# Patient Record
Sex: Female | Born: 1960
Health system: Southern US, Community
[De-identification: ages and names within clinical notes are randomized; demographics above are authoritative.]

## PROBLEM LIST (undated history)

## (undated) DIAGNOSIS — J189 Pneumonia, unspecified organism: Secondary | ICD-10-CM

## (undated) HISTORY — DX: Pneumonia, unspecified organism: J18.9

---

## 2014-09-27 ENCOUNTER — Emergency Department: Payer: Self-pay | Admitting: Emergency Medicine

## 2016-03-18 ENCOUNTER — Other Ambulatory Visit: Payer: Self-pay | Admitting: Family Medicine

## 2016-03-18 ENCOUNTER — Ambulatory Visit
Admission: RE | Admit: 2016-03-18 | Discharge: 2016-03-18 | Disposition: A | Discharge status: Home / Self Care | Payer: BLUE CROSS/BLUE SHIELD | Source: Ambulatory Visit | Attending: Family Medicine | Admitting: Family Medicine

## 2016-03-18 ENCOUNTER — Ambulatory Visit (INDEPENDENT_AMBULATORY_CARE_PROVIDER_SITE_OTHER): Payer: Self-pay | Admitting: Family Medicine

## 2016-03-18 ENCOUNTER — Encounter: Payer: Self-pay | Admitting: Family Medicine

## 2016-03-18 VITALS — BP 119/70 | HR 71 | Temp 98.2°F | Resp 16 | Ht 66.0 in | Wt 144.4 lb

## 2016-03-18 DIAGNOSIS — Z7189 Other specified counseling: Secondary | ICD-10-CM | POA: Diagnosis not present

## 2016-03-18 DIAGNOSIS — Z8249 Family history of ischemic heart disease and other diseases of the circulatory system: Secondary | ICD-10-CM | POA: Diagnosis not present

## 2016-03-18 DIAGNOSIS — Z1239 Encounter for other screening for malignant neoplasm of breast: Secondary | ICD-10-CM

## 2016-03-18 DIAGNOSIS — R918 Other nonspecific abnormal finding of lung field: Secondary | ICD-10-CM | POA: Insufficient documentation

## 2016-03-18 DIAGNOSIS — R05 Cough: Secondary | ICD-10-CM | POA: Insufficient documentation

## 2016-03-18 DIAGNOSIS — R052 Subacute cough: Secondary | ICD-10-CM

## 2016-03-18 DIAGNOSIS — Z1211 Encounter for screening for malignant neoplasm of colon: Secondary | ICD-10-CM | POA: Diagnosis not present

## 2016-03-18 DIAGNOSIS — Z23 Encounter for immunization: Secondary | ICD-10-CM

## 2016-03-18 DIAGNOSIS — Z7689 Persons encountering health services in other specified circumstances: Secondary | ICD-10-CM

## 2016-03-18 DIAGNOSIS — J181 Lobar pneumonia, unspecified organism: Principal | ICD-10-CM

## 2016-03-18 DIAGNOSIS — R053 Chronic cough: Secondary | ICD-10-CM | POA: Insufficient documentation

## 2016-03-18 DIAGNOSIS — J189 Pneumonia, unspecified organism: Secondary | ICD-10-CM

## 2016-03-18 MED ORDER — LORATADINE 10 MG PO TABS: 10.0000 mg | ORAL_TABLET | Freq: Every day | ORAL | Status: AC

## 2016-03-18 MED ORDER — FLUTICASONE PROPIONATE 50 MCG/ACT NA SUSP: 2.0000 | Freq: Every day | NASAL | Status: AC

## 2016-03-18 NOTE — Assessment & Plan Note (Signed)
Post viral cough vs. Allergies. CXR to r/o lung disease. Continue anti-histamine at home. Add fluticasone to help with allergies. Consider silent GERD if symptoms persist. Consider pulmonology referral if persistent.  F/u if not improving.

## 2016-03-18 NOTE — Patient Instructions (Signed)
Cough: Likely due to allergies. We will get a chest XR to rule out that anything is going on in your lungs. Take fluticasone daily to help with symptoms. Continue allergy pills.  Please seek immediate medical attention if you develop shortness of breath not relieve by inhaler, chest pain/tightness, fever > 103 F or other concerning symptoms.    We need a to a well woman exam at one point this year to get your pap smear updated.

## 2016-03-18 NOTE — Telephone Encounter (Signed)
LMTCB: If she calls back please review following information:  CXR does show a pneumonia. Would like to treat her with levofloxacin antibiotic 750mg  once daily for 5 days. We need to do a follow-up chest XR in 4 weeks to ensure it is gone.

## 2016-03-18 NOTE — Progress Notes (Signed)
Subjective:    Patient ID: Regina Dixon, female    DOB: Dec 07, 1960, 55 y.o.   MRN: [id number]  HPI: Regina Dixon is a 55 y.o. female presenting on 03/18/2016 for Establish Care   HPI  Pt presents to establish care today. Previous care provider was in [place].  It has been several years since Her last PCP visit. Records from previous provider will be requested and reviewed. Current medical problems include:  Crush injury to foot at work to L foot. Seen by orthopedics.  Cough- since February. Nurse at work gave her inhaler and Zpak- helped for a little bit- took a steroid. Never had a chest XR. Has improved since starting allergy pills. Cough is not productive of sputum. Some chest tightness. No fevers at home. No linger nasal congestion or ear pain. Throat feels dry.   Health maintenance:  Last pap smear- 20 years. Had 1 abnormal pap smear- years ago. All normal since.  Never had a colonoscopy: Desires cologuard. Mammograms: Had 1- normal. Great had Breast cancer- diagnosed over age 67.  TDAP- has not had in several years.  Father died from brain anuerysm. Cardiac stents.     Past Medical History  Diagnosis Date  . Chronic kidney disease     kidney stone   Social History   Social History  . Marital Status: Single    Spouse Name: N/A  . Number of Children: N/A  . Years of Education: N/A   Occupational History  . Not on file.   Social History Main Topics  . Smoking status: Never Smoker   . Smokeless tobacco: Not on file  . Alcohol Use: Yes  . Drug Use: No  . Sexual Activity: Not on file   Other Topics Concern  . Not on file   Social History Narrative  . No narrative on file   Family History  Problem Relation Age of Onset  . Anuerysm Father   . Hypertension Mother    No current outpatient prescriptions on file prior to visit.   No current facility-administered medications on file prior to visit.    Review of Systems  Constitutional: Negative for fever and  chills.  HENT: Positive for postnasal drip. Negative for congestion, sinus pressure, sneezing and trouble swallowing.   Respiratory: Positive for cough. Negative for chest tightness, shortness of breath and wheezing.   Cardiovascular: Negative for chest pain and leg swelling.  Gastrointestinal: Negative for nausea, vomiting, abdominal pain, diarrhea and constipation.  Endocrine: Negative.  Negative for cold intolerance, heat intolerance, polydipsia, polyphagia and polyuria.  Genitourinary: Negative for dysuria and difficulty urinating.  Musculoskeletal: Negative.  Negative for neck pain and neck stiffness.  Skin: Negative for color change and rash.  Neurological: Negative for dizziness, light-headedness and numbness.  Psychiatric/Behavioral: Negative.    Per HPI unless specifically indicated above     Objective:    BP 119/70 mmHg  Pulse 71  Temp(Src) 98.2 F (36.8 C) (Oral)  Resp 16  Ht  (1.676 m)  Wt 144 lb 6.4 oz (65.499 kg)  BMI 23.32 kg/m2  SpO2 99%  LMP   Wt Readings from Last 3 Encounters:  03/18/16 144 lb 6.4 oz (65.499 kg)    Physical Exam  Constitutional: She is oriented to person, place, and time. She appears well-developed and well-nourished.  HENT:  Head: Normocephalic and atraumatic.  Right Ear: Hearing and tympanic membrane normal.  Left Ear: Hearing and tympanic membrane normal.  Nose: Mucosal edema present. No rhinorrhea. Right sinus  exhibits no maxillary sinus tenderness and no frontal sinus tenderness. Left sinus exhibits no maxillary sinus tenderness and no frontal sinus tenderness.  Mouth/Throat: Uvula is midline and mucous membranes are normal.  Cobblestoning of oropharynx.   Neck: Normal range of motion. Neck supple. No thyromegaly present.  Cardiovascular: Normal rate, regular rhythm and normal heart sounds.  Exam reveals no gallop and no friction rub.   No murmur heard. Pulmonary/Chest: Effort normal and breath sounds normal. She has no wheezes.  She exhibits no tenderness.  Abdominal: Soft. Normal appearance and bowel sounds are normal. She exhibits no distension and no mass. There is no tenderness. There is no rebound and no guarding.  Musculoskeletal: Normal range of motion. She exhibits no edema or tenderness.  Lymphadenopathy:    She has no cervical adenopathy.  Neurological: She is alert and oriented to person, place, and time.  Skin: Skin is warm and dry.  Psychiatric: She has a normal mood and affect. Her behavior is normal. Judgment and thought content normal.   No results found for this or any previous visit.    Assessment & Plan:   Problem List Items Addressed This Visit      Other   Subacute cough    Post viral cough vs. Allergies. CXR to r/o lung disease. Continue anti-histamine at home. Add fluticasone to help with allergies. Consider silent GERD if symptoms persist. Consider pulmonology referral if persistent.  F/u if not improving.       Relevant Orders   Comprehensive metabolic panel   DG Chest 2 View    Other Visit Diagnoses    Encounter to establish care    -  Primary    Screening for breast cancer        Mammogram order placed.     Relevant Orders    MM Digital Screening    Screening for colon cancer        Cologuard ordered.     Relevant Orders    Cologuard    Family history of heart disease        check baseline lipid panel.     Relevant Orders    Lipid Profile       Meds ordered this encounter  Medications  . fluticasone (FLONASE) 50 MCG/ACT nasal spray    Sig: Place 2 sprays into both nostrils daily.    Dispense:  16 g    Refill:  11    Order Specific Question:  Supervising Provider    Answer:  Janeann ForehandHAWKINS JR, JAMES H [phone][[id number]]  . loratadine (CLARITIN) 10 MG tablet    Sig: Take 1 tablet (10 mg total) by mouth daily.    Dispense:  30 tablet    Refill:  11    Order Specific Question:  Supervising Provider    Answer:  Janeann ForehandHAWKINS JR, JAMES H [phone][[id number]]      Follow up plan: Return if  symptoms worsen or fail to improve, for Well woman. .Marland Kitchen

## 2016-03-19 MED ORDER — LEVOFLOXACIN 750 MG PO TABS: 750.0000 mg | ORAL_TABLET | Freq: Every day | ORAL | Status: DC

## 2016-03-19 NOTE — Telephone Encounter (Signed)
Called to review chest XR. VM on cell is not set up. Have left a message with Weyman CroonStan (family member) at home number [facility] Per family she works until [employer] and won't be available to call back until Saturday. Reviewed XR results with family and need for treatment. Have seen to her pharmacy on file. She will call with any questions or concerns about antibiotic treatment.

## 2016-03-27 ENCOUNTER — Telehealth: Payer: Self-pay | Admitting: Family Medicine

## 2016-03-27 MED ORDER — PREDNISONE 20 MG PO TABS: 40.0000 mg | ORAL_TABLET | Freq: Every day | ORAL | Status: DC

## 2016-03-27 MED ORDER — DM-GUAIFENESIN ER 30-600 MG PO TB12: 1.0000 | ORAL_TABLET | Freq: Two times a day (BID) | ORAL | Status: DC

## 2016-03-27 NOTE — Telephone Encounter (Signed)
Called pt still coughing non productive cough but no other Sx but feels chest congestion please suggest?

## 2016-03-27 NOTE — Telephone Encounter (Signed)
Called pt LMTCB: She did have pneumonia on CXR so the cough can linger. Let's try prednisone to help calm the cough. She can also take Mucinex DM OTC to help with symptoms. Keep taking allergy pills. We will do a follow-up CXR the week of June 4. Call if symptoms not improving. Alarm symptoms reviewed.

## 2016-03-27 NOTE — Telephone Encounter (Signed)
Pt was in on 5/9 and is still coughing.  Please call [phone]

## 2016-04-09 ENCOUNTER — Ambulatory Visit
Admission: RE | Admit: 2016-04-09 | Discharge: 2016-04-09 | Disposition: A | Discharge status: Home / Self Care | Payer: BLUE CROSS/BLUE SHIELD | Source: Ambulatory Visit | Attending: Family Medicine | Admitting: Family Medicine

## 2016-04-09 ENCOUNTER — Encounter: Payer: Self-pay | Admitting: Family Medicine

## 2016-04-09 ENCOUNTER — Ambulatory Visit (INDEPENDENT_AMBULATORY_CARE_PROVIDER_SITE_OTHER): Payer: BLUE CROSS/BLUE SHIELD | Admitting: Family Medicine

## 2016-04-09 VITALS — BP 122/65 | HR 70 | Temp 98.5°F | Resp 16 | Ht 66.0 in | Wt 142.0 lb

## 2016-04-09 DIAGNOSIS — Z1211 Encounter for screening for malignant neoplasm of colon: Secondary | ICD-10-CM | POA: Diagnosis not present

## 2016-04-09 DIAGNOSIS — R05 Cough: Secondary | ICD-10-CM | POA: Diagnosis not present

## 2016-04-09 DIAGNOSIS — R052 Subacute cough: Secondary | ICD-10-CM

## 2016-04-09 DIAGNOSIS — Z1212 Encounter for screening for malignant neoplasm of rectum: Secondary | ICD-10-CM | POA: Diagnosis not present

## 2016-04-09 MED ORDER — FLUTICASONE PROPIONATE HFA 110 MCG/ACT IN AERO: 2.0000 | INHALATION_SPRAY | Freq: Two times a day (BID) | RESPIRATORY_TRACT | Status: DC

## 2016-04-09 MED ORDER — ALBUTEROL SULFATE HFA 108 (90 BASE) MCG/ACT IN AERS: 2.0000 | INHALATION_SPRAY | Freq: Four times a day (QID) | RESPIRATORY_TRACT | Status: AC | PRN

## 2016-04-09 NOTE — Progress Notes (Signed)
Subjective:    Patient ID: Adrienne Suydam, female    DOB: 26-May-1961, 55 y.o.   MRN: [id number]  HPI: Nychelle Cassata is a 55 y.o. female presenting on 04/09/2016 for Cough   HPI   Patient here today for follow up for cough, chest congestion and "heavy-chested." Has also been more tired than usual. Took Levaquin as directed as well as expectorant with no relief. Dry cough but denies fever, chills, night sweats, dizziness or other complaints. Denies shortness of breath. Taking loratadine and flonase daily with but hasn't noticed a difference. Had a "sneezing fit" today and states that coughing became worse. Cough worse when she wakes up and at night.  Has had 2 rounds of everything (steroids, antibiotics) with no improvement.   Past Medical History  Diagnosis Date  . Chronic kidney disease     kidney stone    Current Outpatient Prescriptions on File Prior to Visit  Medication Sig  . fluticasone (FLONASE) 50 MCG/ACT nasal spray Place 2 sprays into both nostrils daily.  Marland Kitchen loratadine (CLARITIN) 10 MG tablet Take 1 tablet (10 mg total) by mouth daily.   No current facility-administered medications on file prior to visit.    Review of Systems  Constitutional: Positive for fatigue. Negative for fever, chills, diaphoresis, activity change, appetite change and unexpected weight change.  HENT: Positive for congestion and sneezing. Negative for dental problem, drooling, ear pain, facial swelling, hearing loss, nosebleeds, postnasal drip, rhinorrhea, sinus pressure, sore throat and trouble swallowing.        "Chest heaviness and congestion"  Eyes: Negative for pain, itching and visual disturbance.  Respiratory: Positive for cough and chest tightness. Negative for shortness of breath, wheezing and stridor.   Cardiovascular: Negative for chest pain, palpitations and leg swelling.  Gastrointestinal: Negative for nausea, vomiting, abdominal pain, diarrhea, constipation and abdominal distention.    Endocrine: Negative for cold intolerance and heat intolerance.  Genitourinary: Negative for frequency and difficulty urinating.  Musculoskeletal: Negative for myalgias and arthralgias.  Neurological: Negative for dizziness.  Psychiatric/Behavioral: Negative for suicidal ideas, behavioral problems, confusion and sleep disturbance. The patient is not nervous/anxious.    Per HPI unless specifically indicated above     Objective:    BP 122/65 mmHg  Pulse 70  Temp(Src) 98.5 F (36.9 C) (Oral)  Resp 16  Ht  (1.676 m)  Wt 142 lb (64.411 kg)  BMI 22.93 kg/m2  SpO2 100%  Wt Readings from Last 3 Encounters:  04/09/16 142 lb (64.411 kg)  03/18/16 144 lb 6.4 oz (65.499 kg)    Physical Exam  Constitutional: She is oriented to person, place, and time. She appears well-developed and well-nourished.  HENT:  Head: Normocephalic.  Neck: Normal range of motion.  Cardiovascular: Normal rate, regular rhythm and normal heart sounds.   Pulmonary/Chest: Effort normal and breath sounds normal. No accessory muscle usage. No tachypnea. No respiratory distress. She has no decreased breath sounds. She has no wheezes. She has no rhonchi. She has no rales. Chest wall is not dull to percussion. She exhibits no mass, no tenderness, no crepitus, no deformity and no retraction.  Musculoskeletal: Normal range of motion.  Neurological: She is alert and oriented to person, place, and time.  Skin: Skin is warm and dry.  Psychiatric: She has a normal mood and affect. Her behavior is normal. Judgment and thought content normal.  Nursing note and vitals reviewed.  No results found for this oMabel Rollious visit.    Assessment & Plan:  Problem List Items Addressed This Visit      Other   Subacute cough - Primary    Post infectious cough vs asthma type reaction. CXR is clear. Plan for 2 weeks ICS and albuterol. If still coughing and no improvement- perform spirometry. Consider pulmonology referral.        Relevant Medications   albuterol (PROVENTIL HFA;VENTOLIN HFA) 108 (90 Base) MCG/ACT inhaler   fluticasone (FLOVENT HFA) 110 MCG/ACT inhaler   Other Relevant Orders   DG Chest 2 View (Completed)      Meds ordered this encounter  Medications  . albuterol (PROVENTIL HFA;VENTOLIN HFA) 108 (90 Base) MCG/ACT inhaler    Sig: Inhale 2 puffs into the lungs every 6 (six) hours as needed for wheezing or shortness of breath.    Dispense:  1 Inhaler    Refill:  11    Order Specific Question:  Supervising Provider    Answer:  Janeann ForehandHAWKINS JR, JAMES H [phone][[id number]]  . fluticasone (FLOVENT HFA) 110 MCG/ACT inhaler    Sig: Inhale 2 puffs into the lungs 2 (two) times daily.    Dispense:  1 Inhaler    Refill:  12    Order Specific Question:  Supervising Provider    Answer:  Janeann ForehandHAWKINS JR, JAMES H [[phone]]      Follow up plan: Return in about 2 weeks (around 04/23/2016).

## 2016-04-09 NOTE — Assessment & Plan Note (Signed)
Post infectious cough vs asthma type reaction. CXR is clear. Plan for 2 weeks ICS and albuterol. If still coughing and no improvement- perform spirometry. Consider pulmonology referral.

## 2016-04-09 NOTE — Patient Instructions (Signed)
We will get a chest XR to determine if your pneumonia resolved. That will help use decide where to go from here. For chest tightness and congestion- take flovent inhaler twice daily. Use albuterol as needed.  Please seek immediate medical attention if you develop shortness of breath not relieve by inhaler, chest pain/tightness, fever > 103 F or other concerning symptoms.

## 2016-04-24 LAB — COLOGUARD: Cologuard: NEGATIVE

## 2016-12-11 ENCOUNTER — Ambulatory Visit
Admission: RE | Admit: 2016-12-11 | Discharge: 2016-12-11 | Disposition: A | Discharge status: Home / Self Care | Payer: BLUE CROSS/BLUE SHIELD | Source: Ambulatory Visit | Attending: Family Medicine | Admitting: Family Medicine

## 2016-12-11 ENCOUNTER — Ambulatory Visit (INDEPENDENT_AMBULATORY_CARE_PROVIDER_SITE_OTHER): Payer: BLUE CROSS/BLUE SHIELD | Admitting: Family Medicine

## 2016-12-11 ENCOUNTER — Encounter: Payer: Self-pay | Admitting: Family Medicine

## 2016-12-11 VITALS — BP 140/71 | HR 104 | Temp 99.8°F | Resp 16 | Ht 66.0 in | Wt 141.0 lb

## 2016-12-11 DIAGNOSIS — R509 Fever, unspecified: Secondary | ICD-10-CM | POA: Diagnosis not present

## 2016-12-11 DIAGNOSIS — Z8701 Personal history of pneumonia (recurrent): Secondary | ICD-10-CM | POA: Insufficient documentation

## 2016-12-11 DIAGNOSIS — R05 Cough: Secondary | ICD-10-CM | POA: Diagnosis not present

## 2016-12-11 DIAGNOSIS — R053 Chronic cough: Secondary | ICD-10-CM

## 2016-12-11 DIAGNOSIS — J9809 Other diseases of bronchus, not elsewhere classified: Secondary | ICD-10-CM | POA: Diagnosis not present

## 2016-12-11 DIAGNOSIS — R059 Cough, unspecified: Secondary | ICD-10-CM

## 2016-12-11 DIAGNOSIS — Z87442 Personal history of urinary calculi: Secondary | ICD-10-CM | POA: Insufficient documentation

## 2016-12-11 LAB — POCT INFLUENZA A/B
Influenza A, POC: NEGATIVE
Influenza B, POC: NEGATIVE

## 2016-12-11 MED ORDER — PREDNISONE 50 MG PO TABS: 50.0000 mg | ORAL_TABLET | Freq: Every day | ORAL | 0 refills | Status: DC

## 2016-12-11 NOTE — Progress Notes (Addendum)
Subjective:    Patient ID: Regina Dixon, female    DOB: 05-06-1961, 56 y.o.   MRN: [id number]  Regina Dixon is a 56 y.o. female presenting on 12/11/2016 for Pneumonia (pt had same Sx last year 03/2016 this time it's cough from couple of month unproductive chills and fever, PA at her work RX Doxycycline and inhaler)  Patient presents for a same day appointment.  HPI   CHRONIC COUGH / Recent Flu-like Symptoms / H/o Pneumonia: Reports symptoms started 3 months ago when went to [place] and got a URI virus, initial cold symptoms had resolved, but had a lingering dry cough without productive, some associated intermittent nasal congestion and post nasal drip but then coughing seemed to get worse. Recently 2-3 weeks ago she saw employee health PA at work, and given Doxycycline 100mg  BID for 10 days with significant improvement nearly 75% improved but not 100%, finished about 1 week ago, and then symptoms of cough returned. Now has some worsening in past 24 hours with some possible low grade fever, feeling tired and muscle aches, nausea without vomiting. No known contacts with flu, but possible sick contacts at work. - Tried Albuterol inhaler without significant relief, only using intermittently. Had previously tried cold medicine no improvement. Does not take any anti-histamine / nasal spray flonase. - She has significant related history with nearly identical course over past 1 year. She was seen in 03/2016 at [facility] by prior PCP, had similar lingering dry cough for about 3-5 months until seeking treatment, was initially given Loratadine / Flonase rx for allergic symptoms as thought to be trigger, seemed to have environmental component, then had CXR showed focal opacity Right upper lobe, treated with antibiotic Levaquin 750mg  daily for 5 days, had improvement then some recurrence 3-4 weeks later, treated with prednisone among other treatments. Seemed to get resolution until symptoms resumed again this  winter as above - No prior pulmonary diagnosis, no history of asthma COPD. She is former smoker 1ppd for 5 years but quit 20 years ago, works in Naval architect at [employer], concern with environmental exposure - Admits some nausea without vomiting and some muscle aches started last night - now improved - Denies fevers/chills, sweats, diarrhea, abdominal pain   Social History  Substance Use Topics  . Smoking status: Never Smoker  . Smokeless tobacco: Never Used  . Alcohol use Yes    Review of Systems Per HPI unless specifically indicated above     Objective:    BP 140/71   Pulse (!) 104   Temp 99.8 F (37.7 C) (Oral)   Resp 16   Ht 5\' 6"  (1.676 m)   Wt 141 lb (64 kg)   SpO2 99%   BMI 22.76 kg/m   Wt Readings from Last 3 Encounters:  12/11/16 141 lb (64 kg)  04/09/16 142 lb (64.4 kg)  03/18/16 144 lb 6.4 oz (65.5 kg)    Physical Exam  Constitutional: She appears well-developed and well-nourished. No distress.  Mildly ill-appearing but mostly well, comfortable, cooperative  HENT:  Head: Normocephalic and atraumatic.  Mouth/Throat: Oropharynx is clear and moist.  Frontal / maxillary sinuses non-tender. Nares with some bilateral turbinate edema with some congestion without purulence. Bilateral TMs clear without erythema, effusion or bulging. Oropharynx clear without erythema, exudates, obvious postnasal drainage, edema or asymmetry.  Eyes: Conjunctivae are normal. Right eye exhibits no discharge. Left eye exhibits no discharge.  Neck: Normal range of motion. Neck supple.  Cardiovascular: Regular rhythm, normal heart sounds and intact distal  pulses.   No murmur heard. Mild tachycardia  Pulmonary/Chest: Effort normal. No respiratory distress. She has no wheezes. She has no rales.  Frequent coughing spells. Good air movement but some slightly tight coarse air movement diffusely. No focal crackles or wheezing.  Musculoskeletal: Normal range of motion. She exhibits no edema.    Lymphadenopathy:    She has no cervical adenopathy.  Neurological: She is alert.  Skin: Skin is warm and dry. No rash noted. She is not diaphoretic. No erythema.  Psychiatric: Her behavior is normal.  Nursing note and vitals reviewed.   I have personally reviewed the radiology report from Chest X-ray 2v today 12/11/16  CLINICAL DATA:  Cough for months.  Fever and chills  EXAM: CHEST  2 VIEW  COMPARISON:  Radiograph 04/09/2016  FINDINGS: Normal mediastinum and cardiac silhouette. Normal pulmonary vasculature. No evidence of effusion, infiltrate, or pneumothorax. No acute bony abnormality.  IMPRESSION: No acute cardiopulmonary process.   Electronically Signed   By: Genevive BiStewart  Edmunds M.D.   On: 12/11/2016 11:37  Results for orders placed or performed in visit on 12/11/16  POCT Influenza A/B  Result Value Ref Range   Influenza A, POC Negative Negative   Influenza B, POC Negative Negative      Assessment & Plan:   Problem List Items Addressed This Visit    Recurrent bronchospasm    See A&P for chronic cough - Prednisone burst - Referral to Pulm      Relevant Medications   predniSONE (DELTASONE) 50 MG tablet   Chronic cough - Primary    Unclear exact etiology of chronic lingering non productive cough, history suggests some bronchospasm, possible allergic vs environmental component, recurrent over past 1 year, had same course for about 5 months then eventual improvement, now has had for 3+ months - No formal diagnosis asthma/COPD, former smoker - prior treatments include antibiotics, history of pneumonia, and recently doxycycline - not on regular anti-allergy therapy - Current exam with some frequent bronchospasm coughing, tight airways without overt wheezing but still has good air movement. No focal abnormality. Pulse ox 99% on RA  Plan: 1. Check STAT CXR today for same day results, similar presentation last year with PNA - Reviewed results with patient, negative  for any evidence infiltrate or PNA. No acute cardiopulmonary findings per radiology. My review of CXR is minimally suspicious for some hyperinflation possible mild early COPD changes 2. Start Prednisone burst 50mg  daily for 5 days 3. Start Loratadine 10mg  daily, Flonase 2 sprays each nare daily x 6 weeks then maybe longer as needed 4. May take symptomatic cold medicines right now as needed, less likely to help her cough 5. No repeat antibiotics - negative CXR, possible viral symptoms but no history to suggest pneumonia 6. Referral to [facility] for new patient evaluation and formal PFTs, given chronicity of problem, if not responding to allergic therapy, will need additional assistance on management of this patient's chronic respiratory problem, considered future singulair if is form of bronchospasm 7. Return criteria given if acute worsening, otherwise follow-up as needed within 4 weeks      Relevant Medications   predniSONE (DELTASONE) 50 MG tablet   Other Relevant Orders   DG Chest 2 View (Completed)   POCT Influenza A/B (Completed)    Other Visit Diagnoses    Fever and chills     - Concern for possible flu with acute onset 24 hour symptoms, seems unrelated to her chronic cough for 3 months, no known flu contact.  Negative rapid flu today. If worsening advised to seek treatment and maybe re-test, otherwise treat cough, supportive care - Note for work    Relevant Orders   DG Chest 2 View (Completed)   POCT Influenza A/B (Completed)      Meds ordered this encounter  Medications  . doxycycline (VIBRAMYCIN) 100 MG capsule  . predniSONE (DELTASONE) 50 MG tablet    Sig: Take 1 tablet (50 mg total) by mouth daily with breakfast.    Dispense:  5 tablet    Refill:  0      Follow up plan: Return in about 4 weeks (around 01/08/2017), or if symptoms worsen or fail to improve, for chronic cough.  Saralyn Pilar, DO [facility] 12/11/2016, 1:31 PM

## 2016-12-11 NOTE — Addendum Note (Signed)
Addended by: Smitty CordsKARAMALEGOS, ALEXANDER J on: 12/11/2016 01:31 PM   Modules accepted: Orders

## 2016-12-11 NOTE — Patient Instructions (Signed)
Thank you for coming in to clinic today.  1. I don't have an exact cause of your cough, breathing sounds tight to me, and most likely could be a Bronchospasm similar to a reactive airway problem like Asthma or COPD, this can be triggered by virus or infection or in your cause may be allergies - Chest X-ray today, was clear without pneumonia or fluid. Very mild appearance to me that maybe lungs are slightly hyperinflated can be a sign of COPD as possibility - Flu test - NEGATIVE, again not 100%, still may develop flu like symptoms in few days -- Start Prednisone 50mg  daily for next 5 days - this will open up lungs allow you to breath better and treat that wheezing or bronchospasm - Use Albuterol inhaler 2 puffs every 4-6 hours around the clock for next 2-3 days, max up to 5 days then use as needed - Start Loratadine (Claritin) 10mg  daily and Flonase 2 sprays in each nostril daily for next 4-6 weeks, then you may stop and use seasonally or as needed - Drink plenty of fluids to improve congestion  If your symptoms seem to worsen instead of improve over next several days, including significant fever / chills, worsening shortness of breath, worsening wheezing, or nausea / vomiting and can't take medicines - return sooner or go to hospital Emergency Department for more immediate treatment.  Referral in to [facility] for new patient evaluation and PFTs  [facility] [address], [address] . [place], [place]  Ph [phone]  Please schedule a follow-up appointment with Dr. Althea CharonKaramalegos in 2-4 weeks as needed, sooner if worsening cough], prefer after Pulmonology  If you have any other questions or concerns, please feel free to call the clinic or send a message through MyChart. You may also schedule an earlier appointment if necessary.  Saralyn PilarAlexander Rodderick Holtzer, DO [facility]

## 2016-12-11 NOTE — Assessment & Plan Note (Signed)
Unclear exact etiology of chronic lingering non productive cough, history suggests some bronchospasm, possible allergic vs environmental component, recurrent over past 1 year, had same course for about 5 months then eventual improvement, now has had for 3+ months - No formal diagnosis asthma/COPD, former smoker - prior treatments include antibiotics, history of pneumonia, and recently doxycycline - not on regular anti-allergy therapy - Current exam with some frequent bronchospasm coughing, tight airways without overt wheezing but still has good air movement. No focal abnormality. Pulse ox 99% on RA  Plan: 1. Check STAT CXR today for same day results, similar presentation last year with PNA - Reviewed results with patient, negative for any evidence infiltrate or PNA. No acute cardiopulmonary findings per radiology. My review of CXR is minimally suspicious for some hyperinflation possible mild early COPD changes 2. Start Prednisone burst 50mg  daily for 5 days 3. Start Loratadine 10mg  daily, Flonase 2 sprays each nare daily x 6 weeks then maybe longer as needed 4. May take symptomatic cold medicines right now as needed, less likely to help her cough 5. No repeat antibiotics - negative CXR, possible viral symptoms but no history to suggest pneumonia 6. Referral to [facility] for new patient evaluation and formal PFTs, given chronicity of problem, if not responding to allergic therapy, will need additional assistance on management of this patient's chronic respiratory problem, considered future singulair if is form of bronchospasm 7. Return criteria given if acute worsening, otherwise follow-up as needed within 4 weeks

## 2016-12-11 NOTE — Assessment & Plan Note (Signed)
See A&P for chronic cough - Prednisone burst - [facility]

## 2017-01-06 ENCOUNTER — Encounter: Payer: Self-pay | Admitting: Family Medicine

## 2017-01-06 ENCOUNTER — Ambulatory Visit (INDEPENDENT_AMBULATORY_CARE_PROVIDER_SITE_OTHER): Payer: BLUE CROSS/BLUE SHIELD | Admitting: Family Medicine

## 2017-01-06 VITALS — BP 114/76 | HR 66 | Temp 98.2°F | Resp 16 | Ht 66.0 in | Wt 140.0 lb

## 2017-01-06 DIAGNOSIS — R05 Cough: Secondary | ICD-10-CM | POA: Diagnosis not present

## 2017-01-06 DIAGNOSIS — R053 Chronic cough: Secondary | ICD-10-CM

## 2017-01-06 DIAGNOSIS — J9809 Other diseases of bronchus, not elsewhere classified: Secondary | ICD-10-CM | POA: Diagnosis not present

## 2017-01-06 NOTE — Patient Instructions (Addendum)
Thank you for coming in to clinic today.  1. - Keep up the good work - Continue Loratadine, and Flonase - Use albuterol only as needed  Referral in to [facility] for new patient evaluation for "chronic recurrent cough with bronchospasm" and "PCP requested formal PFT evaluation to determine if asthma"  [facility] [address], [address] . [place], [place]  Ph [phone]  Please schedule a follow-up appointment with Dr. Althea CharonKaramalegos as needed  If you have any other questions or concerns, please feel free to call the clinic or send a message through MyChart. You may also schedule an earlier appointment if necessary.  Saralyn PilarAlexander Karamalegos, DO [facility]

## 2017-01-06 NOTE — Progress Notes (Signed)
Subjective:    Patient ID: Regina Dixon, female    DOB: 11/23/1960, 56 y.o.   MRN: [id number]  Regina Dixon is a 56 y.o. female presenting on 01/06/2017 for Cough (improved but still not over it)  Patient presents for a same day appointment.  HPI   FOLLOW-UP CHRONIC COUGH / Recent Flu-like Symptoms - Resolved - Last visit 12/11/16 with me for new discussion on chronic recurrent cough, allergies, and concern with recent flu-like illness, see note for background details and information. - Reports she is improved now about 3 weeks later, she took initial rx prednisone 50mg  daily x 5 days without dramatic improvement, describes more gradual improvement, has continued Loratadine 10 daily and Flonase daily since then, was using Albuterol occasionally more regular at that time, now has used less often, not waking up at night with coughing, but sometimes cough can come back at night, worse in morning with more congestion and will have some worse cough. - Still has some days with worse symptoms and other days are good, worse with cold air (even air conditioning) - She has returned to gym to resume regular exercise, and has not had any exercise induced symptoms - Has not scheduled yet with [facility], awaiting call (new patient and for PFTs) - No prior pulmonary diagnosis, no history of asthma COPD. 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 Smoking status: Never Smoker  . Smokeless tobacco: Never Used  . Alcohol use Yes    Review of Systems Per HPI unless specifically indicated above     Objective:    BP 114/76   Pulse 66   Temp 98.2 F (36.8 C) (Oral)   Resp 16   Ht 5\' 6"  (1.676 m)   Wt  140 lb (63.5 kg)   BMI 22.60 kg/m   Wt Readings from Last 3 Encounters:  01/06/17 140 lb (63.5 kg)  12/11/16 141 lb (64 kg)  04/09/16 142 lb (64.4 kg)    Physical Exam  Constitutional: She appears well-developed and well-nourished. No distress.  Well-appearing, now improved from prior illness, comfortable, cooperative  HENT:  Head: Normocephalic and atraumatic.  Mouth/Throat: Oropharynx is clear and moist.  Frontal / maxillary sinuses non-tender. Nares with some mild improved turbinate edema, resolved congestion, without purulence. Bilateral TMs clear without erythema or bulging, mild clear effusion L>R. Oropharynx clear without erythema, exudates, resolved post nasal drainage, no edema or asymmetry.  Eyes: Conjunctivae are normal. Right eye exhibits no discharge. Left eye exhibits no discharge.  Neck: Normal range of motion. Neck supple.  Cardiovascular: Normal rate, regular rhythm, normal heart sounds and intact distal pulses.   No murmur heard. Pulmonary/Chest: Effort normal and breath sounds normal. No respiratory distress. She has no wheezes. She has no rales.  Resolved cough. Speaks full sentences. Improved now normal air movement, still has very mild slight prolonged coarse exp breath sounds bilateral bases, however not consistent with wheezing today.  Musculoskeletal: Normal range of motion. She exhibits no edema.  Lymphadenopathy:    She has no cervical adenopathy.  Neurological: She is alert.  Skin: Skin is warm and dry. No rash noted. She is not diaphoretic. No erythema.  Psychiatric: She has a normal mood and affect. Her behavior  is normal.  Nursing note and vitals reviewed.       Assessment & Plan:   Problem List Items Addressed This Visit    Recurrent bronchospasm    See A&P for chronic cough Now mostly resolved. Suspect more allergic etiology / environmental Continue anti-histamine, flonase therapy Awaiting establish with [facility] on 02/20/17, follow-up PFTs  for diagnosis      Chronic cough - Primary    Interval improvement s/p prednisone burst and allergy treatment loratadine/flonase, now resolved URI as well. Cough much improved, seems most likely allergy related due to history. Concern with airway hyperreactivity and possible new dx asthma with some prior bronchospasm and chronic course - Cannot rule out environmental exposure at work as possible factor - No formal diagnosis asthma/COPD, former smoker - last CXR 12/11/16  Plan: 1. Reassurance, seems to be improving 2. Continue treatment with Loratadine 10mg , daily, Flonase 2 sprays each nare daily, continue through March allergy season, may need longer course 3. Not established yet with [facility] - our staff contacted their office to schedule, and now new apt scheduled for 02/20/17, anticipating future formal PFTs from pulm as well 4. Follow-up as needed        No orders of the defined types were placed in this encounter.   Follow up plan: Return if symptoms worsen or fail to improve, for chronic cough / asthma allergies.  Saralyn Pilar, DO [facility] 01/06/2017, 12:06 PM

## 2017-01-06 NOTE — Assessment & Plan Note (Signed)
Interval improvement s/p prednisone burst and allergy treatment loratadine/flonase, now resolved URI as well. Cough much improved, seems most likely allergy related due to history. Concern with airway hyperreactivity and possible new dx asthma with some prior bronchospasm and chronic course - Cannot rule out environmental exposure at work as possible factor - No formal diagnosis asthma/COPD, former smoker - last CXR 12/11/16  Plan: 1. Reassurance, seems to be improving 2. Continue treatment with Loratadine 10mg , daily, Flonase 2 sprays each nare daily, continue through March allergy season, may need longer course 3. Not established yet with [facility] - our staff contacted their office to schedule, and now new apt scheduled for 02/20/17, anticipating future formal PFTs from pulm as well 4. Follow-up as needed

## 2017-01-06 NOTE — Assessment & Plan Note (Signed)
See A&P for chronic cough Now mostly resolved. Suspect more allergic etiology / environmental Continue anti-histamine, flonase therapy Awaiting establish with [facility] on 02/20/17, follow-up PFTs for diagnosis

## 2017-02-20 ENCOUNTER — Ambulatory Visit: Payer: BLUE CROSS/BLUE SHIELD | Admitting: Internal Medicine

## 2017-02-24 NOTE — Progress Notes (Signed)
[facility]  Pulmonary Medicine Consultation      Assessment and Plan:  The patient is a 56 year old female with allergic symptoms, allergic asthma.  Allergic asthma.  -Asthmatic symptoms with dyspnea, cough, which is made worse during the spring season. -We'll start Qvar 80 2 puffs twice daily. -We'll check CBC with differential, IgE. -After blood work. She may resume her Claritin. -Is recommended that she keep pets out of the bedroom.  Allergic rhinitis.  -Patient's Flonase appears to help with her asthmatic symptoms. -Continue Flonase and Claritin  Date: 02/24/2017  MRN# [id number] Regina Dixon July 28, 1961    Regina Dixon is a 56 y.o. old female seen in consultation for chief complaint of:    Chief Complaint  Patient presents with  . Advice Only    per Dr. Althea Charon. Recent CXR 12-11-16. pt states she was dx with PNA in 09/2016, since pt has had lingering non prod cough.     HPI:   The patient is a 57 yo female with history of chronic cough and bronchospasm. - No prior pulmonary diagnosis, no history of asthma COPD. 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   She was diagnosed with pneumonia about 6 months ago with a lingering cough. She is taking claritin and flonase and notes that they help and her cough nearly resolved. She noted that since spring started with the pollen the cough has gotten worse, she has to take the medicine to help with the cough.  She has never been diagnosed with asthma, she last smoked about 20 years ago. She had another pneumonia the year before also.   She has been on a rescue inhaler when she had the pneumonia, and then has only had to use it 3 times after that. She has a dog at home, she has never had allergy testing, dog sleeps in bed.  She thinks that her breathing is worse at work. 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   She notes allergies with nickel, and also rash when she uses antibiotic hand cleansers.   I personally reviewed images; CXR 12/11/16; changes of chronic bronchitis, otherwise unremarkable.   PMHX:   Past Medical History:  Diagnosis Date  . Pneumonia    Surgical Hx:  No past surgical history on file. Family Hx:  Family History  Problem Relation Age of Onset  . Anuerysm Father   . Hypertension Mother    Social Hx:   Social History  Substance Use Topics  . Smoking status: Never Smoker  . Smokeless tobacco: Never Used  . Alcohol use Yes   Medication:   Reviewed.    Allergies:  Soap and Latex  Review of Systems: Gen:  Denies  fever, sweats, chills HEENT: Denies blurred vision, double vision. bleeds, sore throat Cvc:  No dizziness, chest pain. Resp:   Denies cough or sputum production, shortness of breath Gi: Denies swallowing difficulty, stomach pain. Gu:  Denies bladder incontinence, burning urine Ext:   No Joint pain, stiffness. Skin: Notes occasional rash with nickel, antimicrobial soap. Endoc:  No polyuria, polydipsia. Psych: No depression, insomnia. Other:  All other systems were reviewed with the patient and were negative other that what is mentioned in the HPI.   Physical Examination:   VS: BP 126/74 (BP Location: Left Arm, Cuff Size: Normal)  Pulse 75   Ht  (1.676 m)   Wt 137 lb 9.6 oz (62.4 kg)   SpO2 98%   BMI 22.21 kg/m   General Appearance: No distress  Neuro:without focal findings,  speech normal,  HEENT: PERRLA, EOM intact.   Pulmonary: normal breath sounds, No wheezing.  CardiovascularNormal S1,S2.  No m/r/g.   Abdomen: Benign, Soft, non-tender. Renal:  No costovertebral tenderness  GU:  No performed at this time. Endoc: No evident thyromegaly, no signs of acromegaly. Skin:   warm, no rashes, no ecchymosis  Extremities: normal, no cyanosis, clubbing.  Other findings:     LABORATORY PANEL:   CBC No results for input(s): WBC, HGB, HCT, PLT in the last 168 hours. ------------------------------------------------------------------------------------------------------------------  Chemistries  No results for input(s): NA, K, CL, CO2, GLUCOSE, BUN, CREATININE, CALCIUM, MG, AST, ALT, ALKPHOS, BILITOT in the last 168 hours.  Invalid input(s): GFRCGP ------------------------------------------------------------------------------------------------------------------  Cardiac Enzymes No results for input(s): TROPONINI in the last 168 hours. ------------------------------------------------------------  RADIOLOGY:  No results found.     Thank  you for the consultation and for allowing [facility] to assist in the care of your patient. Our recommendations are noted above.  Please contact us if we can be of further service.   Wells Guiles, MD.  Board Certified in Internal Medicine, Pulmonary Medicine, Critical Care Medicine, and Sleep Medicine.  [facility] Office Number: [phone]  Santiago Glad, M.D.  Billy Fischer, M.D  02/24/2017

## 2017-02-26 ENCOUNTER — Ambulatory Visit (INDEPENDENT_AMBULATORY_CARE_PROVIDER_SITE_OTHER): Payer: BLUE CROSS/BLUE SHIELD | Admitting: Internal Medicine

## 2017-02-26 ENCOUNTER — Encounter: Payer: Self-pay | Admitting: Internal Medicine

## 2017-02-26 VITALS — BP 126/74 | HR 75 | Ht 66.0 in | Wt 137.6 lb

## 2017-02-26 DIAGNOSIS — J4540 Moderate persistent asthma, uncomplicated: Secondary | ICD-10-CM | POA: Diagnosis not present

## 2017-02-26 MED ORDER — BECLOMETHASONE DIPROP HFA 80 MCG/ACT IN AERB: 2.0000 | INHALATION_SPRAY | Freq: Two times a day (BID) | RESPIRATORY_TRACT | 5 refills | Status: AC

## 2017-02-26 MED ORDER — BECLOMETHASONE DIPROPIONATE 80 MCG/ACT IN AERS: 2.0000 | INHALATION_SPRAY | Freq: Two times a day (BID) | RESPIRATORY_TRACT | 6 refills | Status: DC

## 2017-02-26 NOTE — Addendum Note (Signed)
Addended by: Maxwell Marion A on: 02/26/2017 10:55 AM   Modules accepted: Orders

## 2017-02-26 NOTE — Patient Instructions (Addendum)
--  CBC with differential; will check IgE level. Stop claritin for one week before checking blood test.   --Other wise continue flonase and claritin.   --Recommend that you keep pets out of the bedroom.   --Qvar 80 inhaler 2 puffs twice daily; rinse mouth after use.

## 2017-02-26 NOTE — Addendum Note (Signed)
Addended by: Maxwell Marion A on: 02/26/2017 09:25 AM   Modules accepted: Orders

## 2017-03-05 ENCOUNTER — Other Ambulatory Visit
Admission: RE | Admit: 2017-03-05 | Discharge: 2017-03-05 | Disposition: A | Discharge status: Home / Self Care | Payer: BLUE CROSS/BLUE SHIELD | Source: Ambulatory Visit | Attending: Internal Medicine | Admitting: Internal Medicine

## 2017-03-05 DIAGNOSIS — J4540 Moderate persistent asthma, uncomplicated: Secondary | ICD-10-CM

## 2017-03-05 LAB — CBC WITH DIFFERENTIAL/PLATELET
BASOS PCT: 0 %
Basophils Absolute: 0 10*3/uL (ref 0–0.1)
EOS ABS: 0.2 10*3/uL (ref 0–0.7)
Eosinophils Relative: 3 %
HEMATOCRIT: 36.2 % (ref 35.0–47.0)
Hemoglobin: 12.4 g/dL (ref 12.0–16.0)
Lymphocytes Relative: 43 %
Lymphs Abs: 2.8 10*3/uL (ref 1.0–3.6)
MCH: 30.4 pg (ref 26.0–34.0)
MCHC: 34.3 g/dL (ref 32.0–36.0)
MCV: 88.5 fL (ref 80.0–100.0)
MONO ABS: 0.5 10*3/uL (ref 0.2–0.9)
MONOS PCT: 8 %
NEUTROS ABS: 3 10*3/uL (ref 1.4–6.5)
Neutrophils Relative %: 46 %
PLATELETS: 233 10*3/uL (ref 150–440)
RBC: 4.09 MIL/uL (ref 3.80–5.20)
RDW: 13.5 % (ref 11.5–14.5)
WBC: 6.5 10*3/uL (ref 3.6–11.0)

## 2017-03-10 LAB — IGE: IgE (Immunoglobulin E), Serum: 103 IU/mL — ABNORMAL HIGH (ref 0–100)

## 2017-04-02 IMAGING — DX DG CHEST 2V
2 series · 2 of 2 positions shown · non-contrast
Comparison: None.

CLINICAL DATA: Dry cough for 3 months. Chest tightness. Former
smoker.

EXAM:
CHEST  2 VIEW

[chest pa]
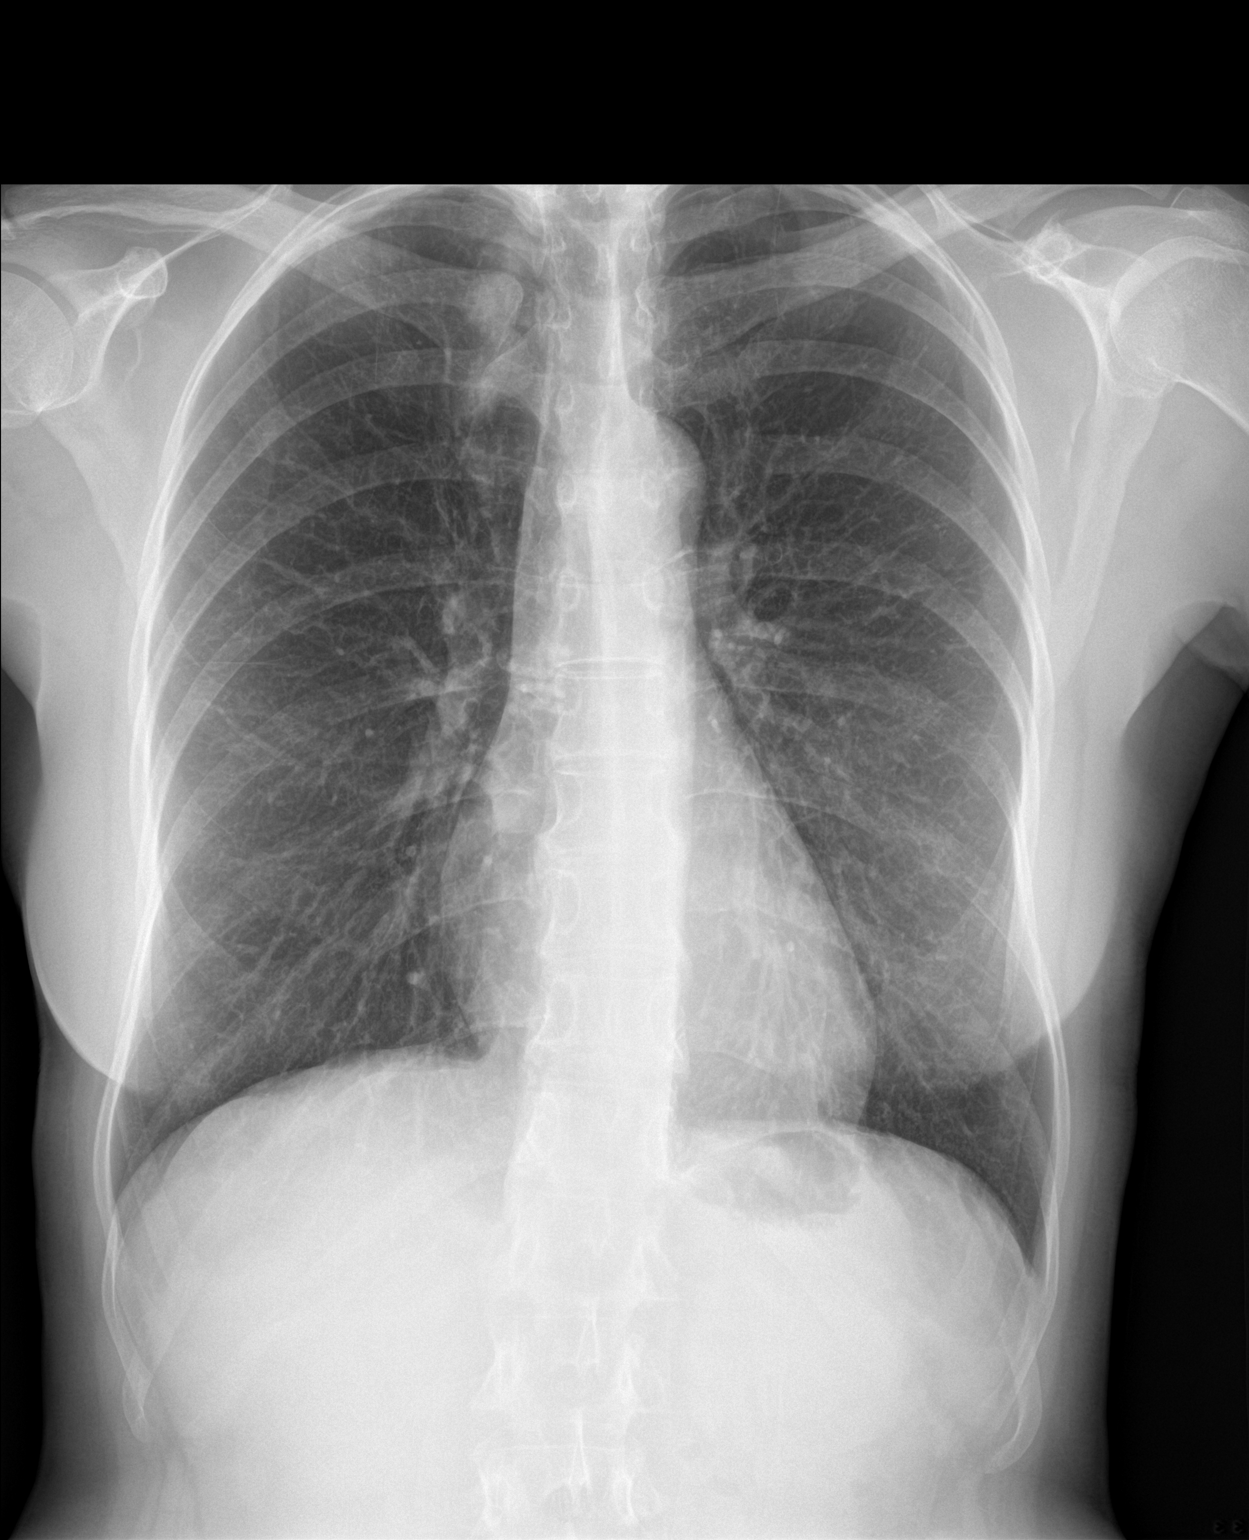

[chest lat]
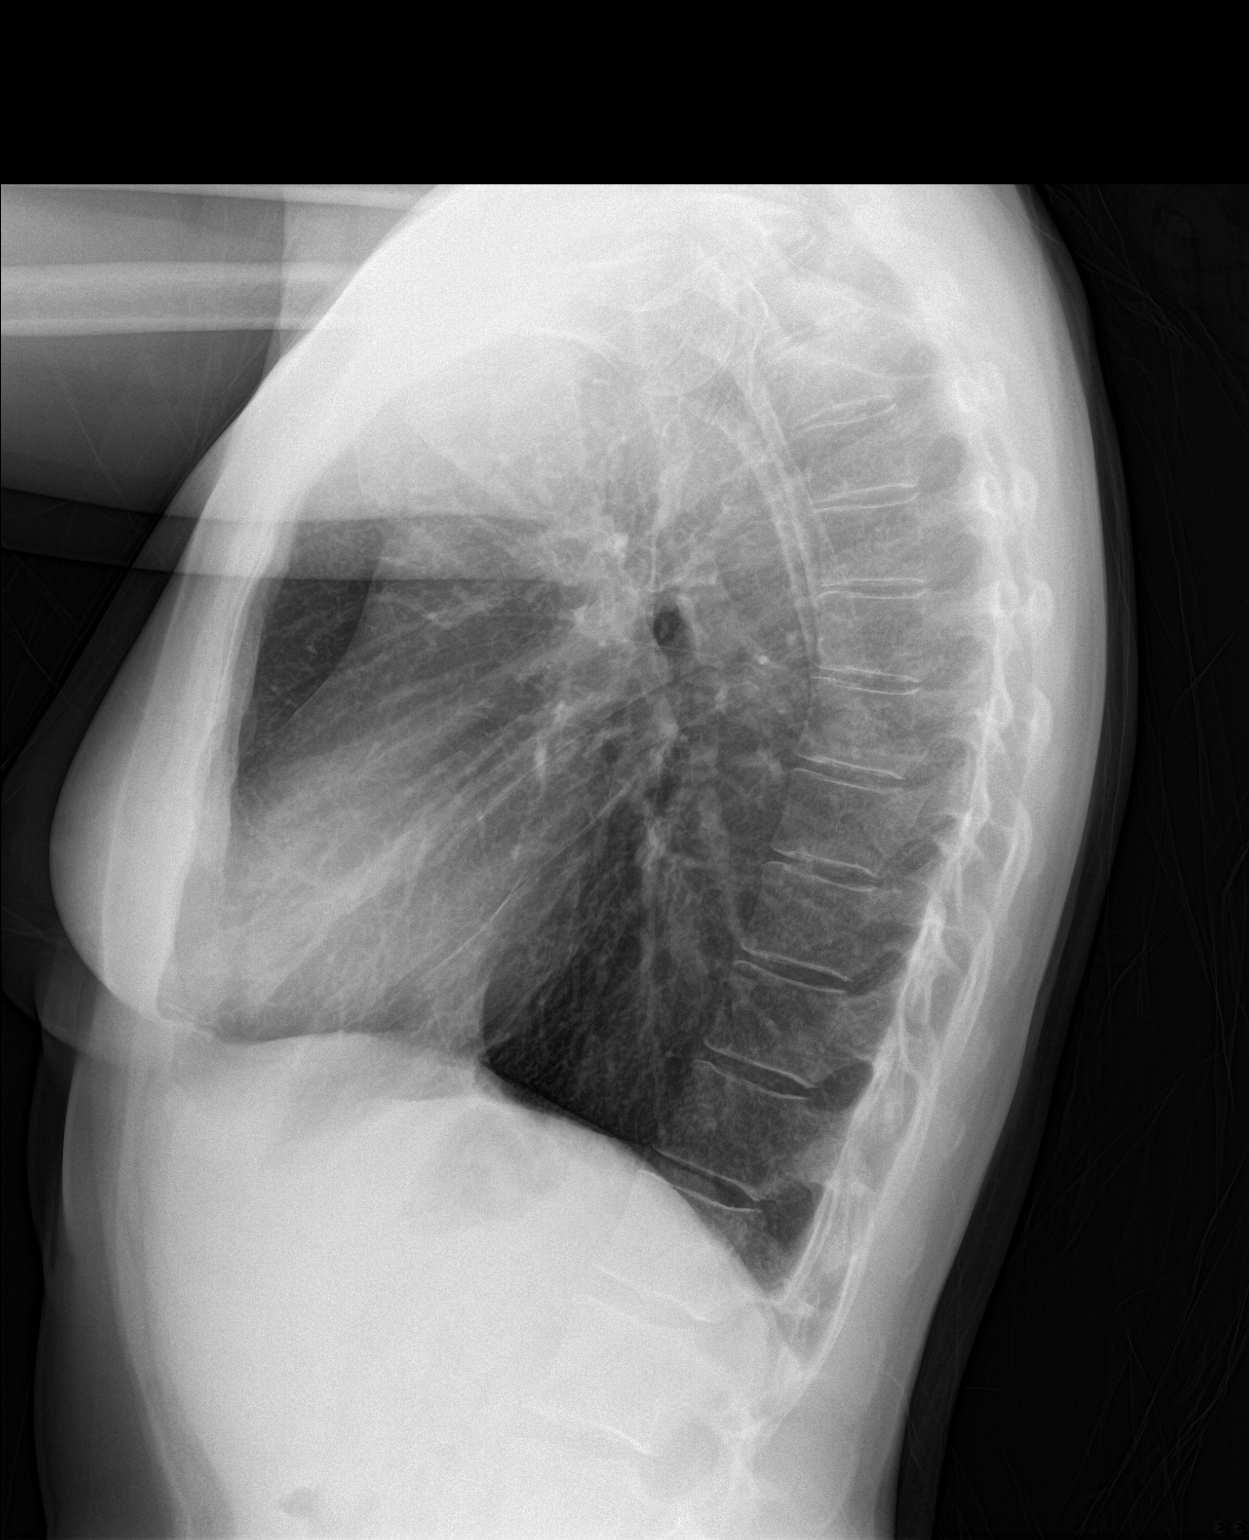

[2 of 2 positions shown; findings below may reference images not displayed]

FINDINGS: Area of opacity noted along the medial right upper lobe, partly
projecting over the medial clavicle. Lungs are hyperexpanded but
otherwise clear.

Cardiac silhouette is normal in size and configuration. Normal
mediastinal and hilar contours.

No pleural effusion or pneumothorax.

Skeletal structures are unremarkable.
IMPRESSION: 1. Opacity in the medial right upper lobe. This may reflect
pneumonia. Recommend follow-up chest radiograph after treatment to
confirm resolution. If symptoms are atypical for pneumonia, consider
follow-up chest CT with contrast for further assessment.

## 2017-12-26 IMAGING — DX DG CHEST 2V
2 series · 2 of 2 positions shown · non-contrast
Comparison: Radiograph 04/09/2016

CLINICAL DATA: Cough for months.  Fever and chills

EXAM:
CHEST  2 VIEW

[chest pa]
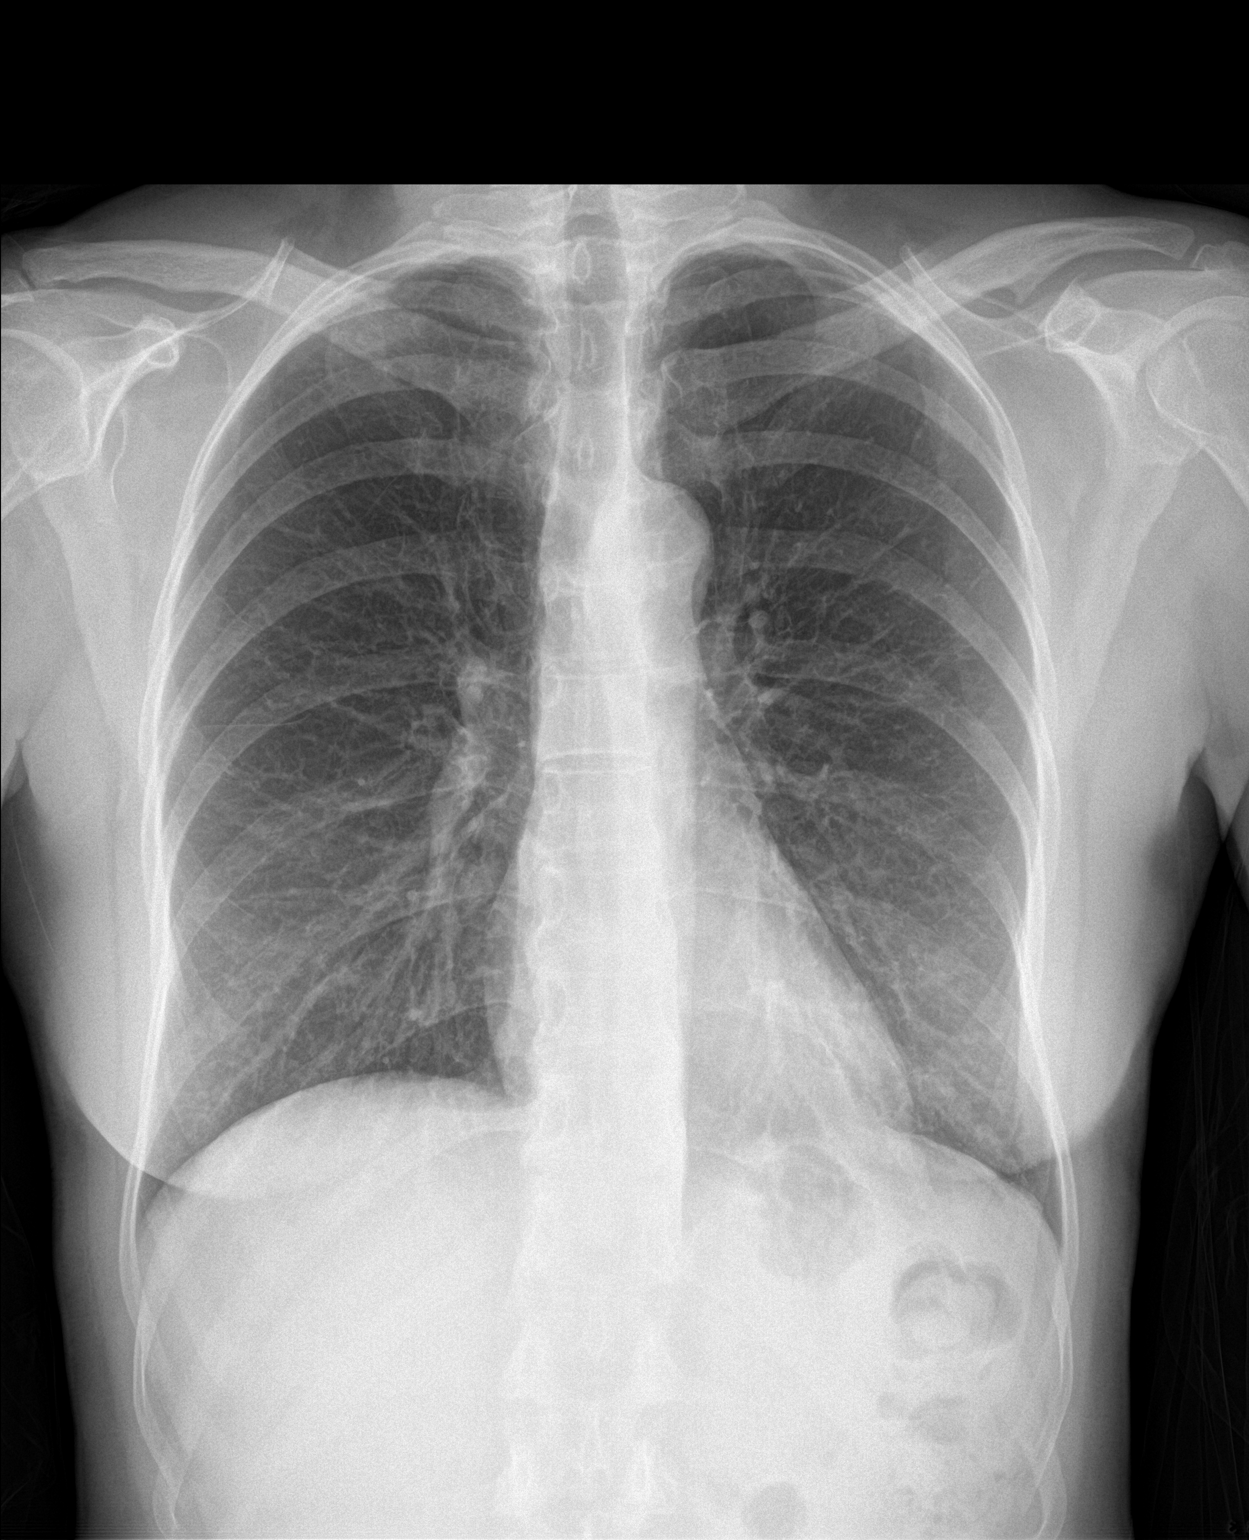

[chest lat]
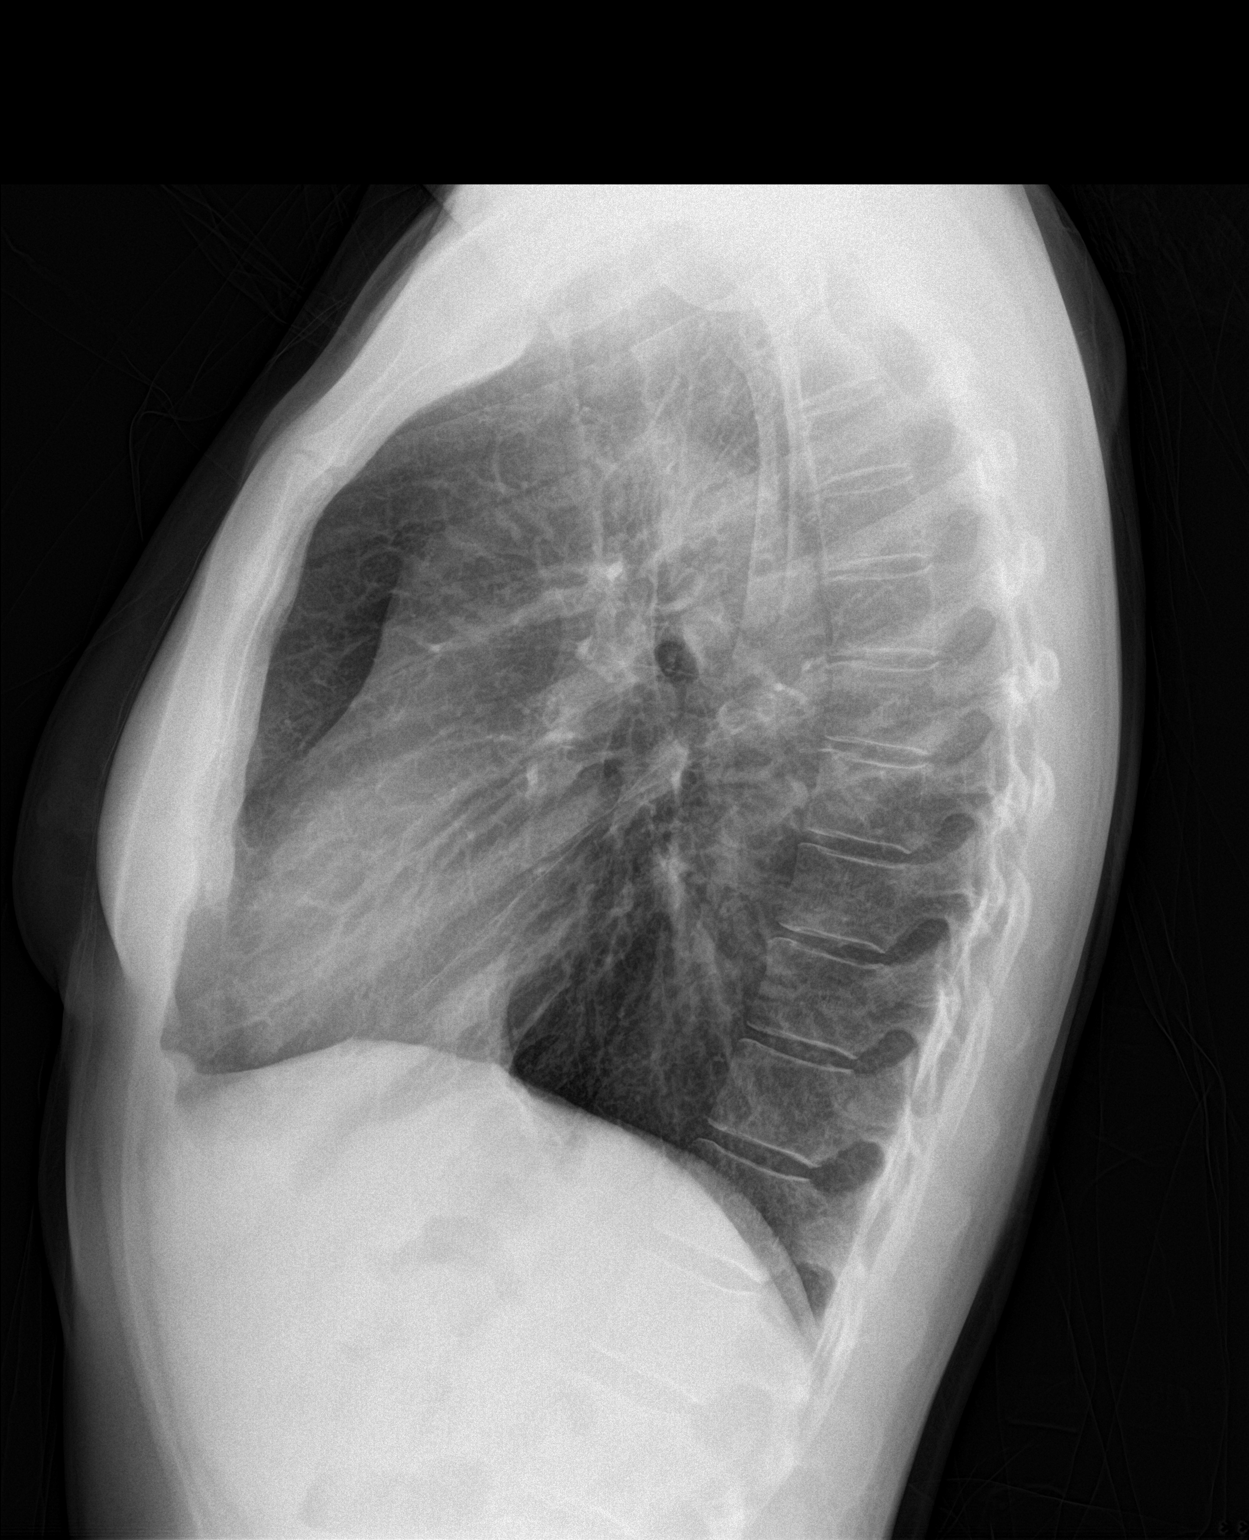

[2 of 2 positions shown; findings below may reference images not displayed]

FINDINGS: Normal mediastinum and cardiac silhouette. Normal pulmonary
vasculature. No evidence of effusion, infiltrate, or pneumothorax.
No acute bony abnormality.
IMPRESSION: No acute cardiopulmonary process.

## 2018-03-15 DIAGNOSIS — R350 Frequency of micturition: Secondary | ICD-10-CM | POA: Diagnosis not present

## 2018-03-18 ENCOUNTER — Encounter: Payer: Self-pay | Admitting: Internal Medicine

## 2018-03-18 ENCOUNTER — Telehealth: Payer: Self-pay | Admitting: Internal Medicine

## 2018-03-18 NOTE — Telephone Encounter (Signed)
3 attempts to schedule fu appt from recall list.   Deleting recall.  °Mailed Letter  °

## 2018-12-24 LAB — HM MAMMOGRAPHY

## 2018-12-30 ENCOUNTER — Telehealth: Payer: Self-pay

## 2018-12-30 NOTE — Telephone Encounter (Signed)
Error

## 2019-06-15 DIAGNOSIS — T63461A Toxic effect of venom of wasps, accidental (unintentional), initial encounter: Secondary | ICD-10-CM | POA: Diagnosis not present

## 2019-06-15 DIAGNOSIS — L089 Local infection of the skin and subcutaneous tissue, unspecified: Secondary | ICD-10-CM | POA: Diagnosis not present

## 2019-06-16 ENCOUNTER — Encounter: Payer: Self-pay | Admitting: Family Medicine

## 2019-06-16 ENCOUNTER — Other Ambulatory Visit: Payer: Self-pay

## 2019-06-16 ENCOUNTER — Ambulatory Visit (INDEPENDENT_AMBULATORY_CARE_PROVIDER_SITE_OTHER): Payer: BC Managed Care – PPO | Admitting: Family Medicine

## 2019-06-16 VITALS — BP 131/67 | HR 65 | Temp 98.5°F | Resp 16 | Ht 66.0 in | Wt 136.0 lb

## 2019-06-16 DIAGNOSIS — M7989 Other specified soft tissue disorders: Secondary | ICD-10-CM | POA: Diagnosis not present

## 2019-06-16 DIAGNOSIS — T63441A Toxic effect of venom of bees, accidental (unintentional), initial encounter: Secondary | ICD-10-CM

## 2019-06-16 MED ORDER — PREDNISONE 10 MG PO TABS: ORAL_TABLET | ORAL | 0 refills | Status: AC

## 2019-06-16 NOTE — Patient Instructions (Addendum)
Thank you for coming to the office today.  Looks like allergic reaction from bee stings.  Start Prednisone taper 60 down to 10mg  over 6 days, down by 1 pill each day - take with food.  If improve but then worse again on low dose, call on Monday we can extend it if need.  Continue topical anti itch  Let me know if need cortisone cream.  HOLD antibiotic - for now - if this reaction improves, but you still have redness lingering with warmth and maybe fever or spreading redness, can start antibiotic course.  [place] Address: [address], [place], [place] [address] Open Closes 8PM Phone: [phone]   Please schedule a Follow-up Appointment to: Return in about 1 week (around 06/23/2019), or if symptoms worsen or fail to improve, for bee sting.  If you have any other questions or concerns, please feel free to call the office or send a message through Home. You may also schedule an earlier appointment if necessary.  Additionally, you may be receiving a survey about your experience at our office within a few days to 1 week by e-mail or mail. We value your feedback.  Nobie Putnam, DO [place]

## 2019-06-16 NOTE — Progress Notes (Signed)
Subjective:    Patient ID: Regina EbbsSusan Dixon, female    DOB: 02/27/1961, 58 y.o.   MRN: [id number]  Regina EbbsSusan Yahnke is a 58 y.o. female presenting on 06/16/2019 for Insect Bite (onset 2 days)  Patient presents for a same day appointment. Last apt was 2 years ago in 2018  HPI   BEE STING ALLERGIC REACTION, R Hand Swelling, L arm swelling Reports symptoms started about 2 days ago with bee sting multiple (R hand and L upper arm) in evening, she took benadryl when it happened, developed almost immediate swelling some redness and initially pain but now pain has improved. She did not improve yesterday so [facility], they gave her a Bactrim antibiotic twice a day but she did not start it because she did not think she needed antibiotic. In past Similar reaction before to bee stings, required urgent care and given steroid shot - Denies any anaphylaxis reaction before - Denies any fever chills sweats, radiating redness or other areas of swelling, ulceration, drainage of pus, throat swelling or dyspnea, nausea vomiting   Depression screen PHQ 2/9 06/16/2019  Decreased Interest 0  Down, Depressed, Hopeless 0  PHQ - 2 Score 0    Social History   Tobacco Use  . Smoking status: Former Smoker    Packs/day: 1.00    Years: 10.00    Pack years: 10.00  . Smokeless tobacco: Never Used  . Tobacco comment: quit smoking in 1998  Substance Use Topics  . Alcohol use: Yes    Comment: occ  . Drug use: No    Review of Systems Per HPI unless specifically indicated above     Objective:    BP 131/67   Pulse 65   Temp 98.5 F (36.9 C) (Oral)   Resp 16   Ht 5\' 6"  (1.676 m)   Wt 136 lb (61.7 kg)   BMI 21.95 kg/m   Wt Readings from Last 3 Encounters:  06/16/19 136 lb (61.7 kg)  02/26/17 137 lb 9.6 oz (62.4 kg)  01/06/17 140 lb (63.5 kg)    Physical Exam Vitals signs and nursing note reviewed.  Constitutional:      General: She is not in acute distress.    Appearance:  She is well-developed. She is not diaphoretic.     Comments: Well-appearing, comfortable, cooperative  HENT:     Head: Normocephalic and atraumatic.  Eyes:     General:        Right eye: No discharge.        Left eye: No discharge.     Conjunctiva/sclera: Conjunctivae normal.  Cardiovascular:     Rate and Rhythm: Normal rate.  Pulmonary:     Effort: Pulmonary effort is normal.  Skin:    General: Skin is warm and dry.     Findings: No erythema or rash.     Comments: Right Hand Generalized soft non pitting edema of most fingers mostly R thumb index finger and hand dorsal primarily and wrist, some mild erythema and warmth.  L upper arm - significant area of erythema and swelling, warmth, non tender, localized to L tricep area  Neurological:     Mental Status: She is alert and oriented to person, place, and time.  Psychiatric:        Behavior: Behavior normal.     Comments: Well groomed, good eye contact, normal speech and thoughts        Assessment & Plan:   Problem List Items  Addressed This Visit    None    Visit Diagnoses    Bee sting reaction, accidental or unintentional, initial encounter    -  Primary   Relevant Medications   predniSONE (DELTASONE) 10 MG tablet   Swelling of right hand          Persistent acute allergic reaction, localized swelling due to bee venom sting/bite x 2 locations, 48 hours ago. WIthout resolution. No sign of anaphylaxis or systemic allergic response. - Did not start bactrim antibiotic from urgent care  Plan - Start Prednisone taper 60 to 10mg  over 6 days - use topical anti itch - Offered other option hydroxyzine vs benadryl oral - Follow-up if not improved, may consider extend prednisone vs steroid injection, given 48 hours out now would start with oral course - Handout address of med center [facility] if need in future - Return criteria given  Meds ordered this encounter  Medications  . predniSONE (DELTASONE) 10 MG tablet    Sig:  Take 6 tabs with breakfast Day 1, 5 tabs Day 2, 4 tabs Day 3, 3 tabs Day 4, 2 tabs Day 5, 1 tab Day 6.    Dispense:  21 tablet    Refill:  0    Follow up plan: Return in about 1 week (around 06/23/2019), or if symptoms worsen or fail to improve, for bee sting.   Nobie Putnam, [facility] 06/16/2019, 11:54 AM

## 2020-06-05 ENCOUNTER — Encounter: Payer: Self-pay | Admitting: Family Medicine
